# Patient Record
Sex: Female | Born: 2000 | Race: Black or African American | Hispanic: Yes | Marital: Single | Smoking: Never smoker
Health system: Southern US, Community
[De-identification: ages and names within clinical notes are randomized; demographics above are authoritative.]

---

## 2019-11-02 ENCOUNTER — Other Ambulatory Visit: Payer: Self-pay

## 2019-11-02 ENCOUNTER — Ambulatory Visit (HOSPITAL_COMMUNITY)
Admission: EM | Admit: 2019-11-02 | Discharge: 2019-11-02 | Disposition: A | Discharge status: Home / Self Care | Payer: PRIVATE HEALTH INSURANCE | Attending: Emergency Medicine | Admitting: Emergency Medicine

## 2019-11-02 ENCOUNTER — Encounter (HOSPITAL_COMMUNITY): Payer: Self-pay | Admitting: Emergency Medicine

## 2019-11-02 DIAGNOSIS — N765 Ulceration of vagina: Secondary | ICD-10-CM

## 2019-11-02 LAB — HIV ANTIBODY (ROUTINE TESTING W REFLEX): HIV Screen 4th Generation wRfx: NONREACTIVE

## 2019-11-02 MED ORDER — VALACYCLOVIR HCL 1 G PO TABS: 1000.0000 mg | ORAL_TABLET | Freq: Two times a day (BID) | ORAL | 0 refills | Status: AC

## 2019-11-02 NOTE — Discharge Instructions (Signed)
I am concerned about herpes to the vagina after seeing you today.  We are culturing the ulceration for herpes. We will start medication in the meantime.  I would avoid intercourse until this heals as it can be spread and to prevent pain.  We will call with any positive results from your additional std screening.  If all testing is negative and still with symptoms don't hesitate to return or follow up with your gynecologist

## 2019-11-02 NOTE — ED Provider Notes (Signed)
[facility]    CSN: [id number] Arrival date & time: 11/02/19  1511      History   Chief Complaint Chief Complaint  Patient presents with  . Abscess    HPI Madison Mcmillan is a 19 y.o. female.   Madison Mcmillan presents with complaints of pain and soreness to vagina which she noted approximately 1 week ago. She is currently being treated for UTI, bladder symptoms had started to improve and then noted vaginal discomfort. Denies any previous similar. No drainage from the area but has noted increased vaginal discharge and odor. She is sexually active with 1 partner and doesn't use condoms. Was screened for STI's approximately 2 months ago last which was negative. Doesn't have regular periods related to birth control, she is currently on oral birth control, switched from birth control patch. She is visiting from [place] per HPI, negative if not otherwise mentioned.      History reviewed. No pertinent past medical history.  There are no problems to display for this patient.   History reviewed. No pertinent surgical history.  OB History   No obstetric history on file.      Home Medications    Prior to Admission medications   Medication Sig Start Date End Date Taking? Authorizing Provider  valACYclovir (VALTREX) 1000 MG tablet Take 1 tablet (1,000 mg total) by mouth 2 (two) times daily for 10 days. 11/02/19 11/12/19  Zigmund Gottron, NP    Family History History reviewed. No pertinent family history.  Social History Social History   Tobacco Use  . Smoking status: Never Smoker  . Smokeless tobacco: Never Used  Substance Use Topics  . Alcohol use: Never  . Drug use: Yes    Types: Marijuana     Allergies   Patient has no known allergies.   Review of Systems Review of Systems   Physical Exam Triage Vital Signs ED Triage Vitals  Enc Vitals Group     BP 11/02/19 1618 122/69     Pulse Rate 11/02/19 1618 95     Resp 11/02/19 1618 16     Temp  11/02/19 1618 98.9 F (37.2 C)     Temp Source 11/02/19 1618 Oral     SpO2 11/02/19 1618 100 %     Weight --      Height --      Head Circumference --      Peak Flow --      Pain Score 11/02/19 1614 8     Pain Loc --      Pain Edu? --      Excl. in [place]? --    No data found.  Updated Vital Signs BP 122/69 (BP Location: Left Arm)   Pulse 95   Temp 98.9 F (37.2 C) (Oral)   Resp 16   SpO2 100%    Physical Exam Constitutional:      General: She is not in acute distress.    Appearance: She is well-developed.  Cardiovascular:     Rate and Rhythm: Normal rate.  Pulmonary:     Effort: Pulmonary effort is normal.  Genitourinary:      Comments: Approximately 51mm in diameter ulceration to left labia minora; noted yellow/green discharge from vagina with malodor Skin:    General: Skin is warm and dry.  Neurological:     Mental Status: She is alert and oriented to person, place, and time.      UC Treatments / Results  Labs (all labs ordered are listed, but only abnormal results are displayed) Labs Reviewed  HSV CULTURE AND TYPING  HIV ANTIBODY (ROUTINE TESTING W REFLEX)  RPR  CERVICOVAGINAL ANCILLARY ONLY    EKG   Radiology No results found.  Procedures Procedures (including critical care time)  Medications Ordered in UC Medications - No data to display  Initial Impression / Assessment and Plan / UC Course  I have reviewed the triage vital signs and the nursing notes.  Pertinent labs & imaging results that were available during my care of the patient were reviewed by me and considered in my medical decision making (see chart for details).     Concern for herpes lesion with HSV culture collected and pending. Vaginal cytology collected and pending as well. Empiric valtrex provided at this time. Safe sex encouraged. Return precautions provided. Patient verbalized understanding and agreeable to plan.   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  We are culturing the ulceration for herpes. We will start medication in the meantime.  I would avoid intercourse until this heals as it can be spread and to prevent pain.  We will call with any positive results from your additional std screening.  If all testing is negative and still with symptoms don't hesitate to return or follow up with your gynecologist    ED Prescriptions    Medication Sig Dispense Auth. Provider   valACYclovir (VALTREX) 1000 MG tablet Take 1 tablet (1,000 mg total) by mouth 2 (two) times daily for 10 days. 20 tablet Georgetta Haber, NP     PDMP not reviewed this encounter.   Georgetta Haber, NP 11/02/19 1744

## 2019-11-02 NOTE — ED Triage Notes (Signed)
Pt here for abscess on groin onset 1 week associated pain, swelling  Denies fevers, drainage  A&O x4... NAD.Marland Kitchen. ambulatory

## 2019-11-03 LAB — RPR: RPR Ser Ql: NONREACTIVE

## 2019-11-05 LAB — HSV CULTURE AND TYPING

## 2019-11-06 ENCOUNTER — Telehealth: Payer: Self-pay | Admitting: Emergency Medicine

## 2019-11-06 LAB — CERVICOVAGINAL ANCILLARY ONLY
Bacterial vaginitis: POSITIVE — AB
Candida vaginitis: NEGATIVE
Chlamydia: NEGATIVE
Neisseria Gonorrhea: NEGATIVE
Trichomonas: NEGATIVE

## 2019-11-06 MED ORDER — METRONIDAZOLE 500 MG PO TABS: 500.0000 mg | ORAL_TABLET | Freq: Two times a day (BID) | ORAL | 0 refills | Status: AC

## 2019-11-06 NOTE — Telephone Encounter (Signed)
Culture positive for HSV type 2. BV on cervical swab. Contacted pt by phone to discuss results and follow up information. Pt was prescribed valtrex. Answered all questions. BV treatment sent to preferred pharmacy.

## 2019-11-08 ENCOUNTER — Ambulatory Visit: Payer: PRIVATE HEALTH INSURANCE | Attending: Internal Medicine

## 2019-11-08 DIAGNOSIS — Z20822 Contact with and (suspected) exposure to covid-19: Secondary | ICD-10-CM

## 2019-11-10 LAB — NOVEL CORONAVIRUS, NAA: SARS-CoV-2, NAA: NOT DETECTED

## 2020-02-07 ENCOUNTER — Ambulatory Visit: Payer: PRIVATE HEALTH INSURANCE | Attending: Family

## 2020-02-07 ENCOUNTER — Ambulatory Visit: Payer: PRIVATE HEALTH INSURANCE

## 2020-02-07 DIAGNOSIS — Z23 Encounter for immunization: Secondary | ICD-10-CM

## 2020-02-07 NOTE — Progress Notes (Signed)
   Covid-19 Vaccination Clinic  Name:  Madison Mcmillan    MRN: [id number] DOB: 18-Sep-2001  02/07/2020  Madison Mcmillan was observed post Covid-19 immunization for 15 minutes without incident. She was provided with Vaccine Information Sheet and instruction to access the V-Safe system.   Madison Mcmillan was instructed to call 911 with any severe reactions post vaccine: Marland Kitchen Difficulty breathing  . Swelling of face and throat  . A fast heartbeat  . A bad rash all over body  . Dizziness and weakness   Immunizations Administered    Name Date Dose VIS Date Route   Moderna COVID-19 Vaccine 02/07/2020  4:41 PM 0.5 mL 10/02/2019 Intramuscular   Manufacturer: Moderna   Lot: [id number]   NDC: 43735-789-78

## 2020-03-11 ENCOUNTER — Ambulatory Visit: Payer: PRIVATE HEALTH INSURANCE

## 2020-03-13 ENCOUNTER — Ambulatory Visit: Payer: PRIVATE HEALTH INSURANCE | Attending: Family

## 2020-03-13 DIAGNOSIS — Z23 Encounter for immunization: Secondary | ICD-10-CM

## 2020-03-13 NOTE — Progress Notes (Signed)
   Covid-19 Vaccination Clinic  Name:  Madison Mcmillan    MRN: [id number] DOB: 2000-12-27  03/13/2020  [name] was observed post Covid-19 immunization for 15 minutes without incident. She was provided with Vaccine Information Sheet and instruction to access the V-Safe system.   [name] was instructed to call 911 with any severe reactions post vaccine: Marland Kitchen Difficulty breathing  . Swelling of face and throat  . A fast heartbeat  . A bad rash all over body  . Dizziness and weakness   Immunizations Administered    Name Date Dose VIS Date Route   Moderna COVID-19 Vaccine 03/13/2020  3:20 PM 0.5 mL 10/2019 Intramuscular   Manufacturer: Moderna   Lot: [id number]   NDC: 94090-502-56

## 2020-07-18 ENCOUNTER — Encounter (HOSPITAL_COMMUNITY): Payer: Self-pay | Admitting: Pediatrics

## 2020-07-18 ENCOUNTER — Other Ambulatory Visit: Payer: Self-pay

## 2020-07-18 ENCOUNTER — Inpatient Hospital Stay (HOSPITAL_COMMUNITY)
Admission: AD | Admit: 2020-07-18 | Discharge: 2020-07-18 | Disposition: A | Discharge status: Home / Self Care | Payer: BC Managed Care – PPO | Attending: Obstetrics & Gynecology | Admitting: Obstetrics & Gynecology

## 2020-07-18 DIAGNOSIS — R5383 Other fatigue: Secondary | ICD-10-CM | POA: Insufficient documentation

## 2020-07-18 DIAGNOSIS — R42 Dizziness and giddiness: Secondary | ICD-10-CM | POA: Insufficient documentation

## 2020-07-18 DIAGNOSIS — N939 Abnormal uterine and vaginal bleeding, unspecified: Secondary | ICD-10-CM | POA: Diagnosis present

## 2020-07-18 DIAGNOSIS — R531 Weakness: Secondary | ICD-10-CM | POA: Diagnosis not present

## 2020-07-18 LAB — CBC WITH DIFFERENTIAL/PLATELET
Abs Immature Granulocytes: 0.01 10*3/uL (ref 0.00–0.07)
Basophils Absolute: 0 10*3/uL (ref 0.0–0.1)
Basophils Relative: 1 %
Eosinophils Absolute: 0.1 10*3/uL (ref 0.0–0.5)
Eosinophils Relative: 3 %
HCT: 39.5 % (ref 36.0–46.0)
Hemoglobin: 12.6 g/dL (ref 12.0–15.0)
Immature Granulocytes: 0 %
Lymphocytes Relative: 29 %
Lymphs Abs: 1.3 10*3/uL (ref 0.7–4.0)
MCH: 30.1 pg (ref 26.0–34.0)
MCHC: 31.9 g/dL (ref 30.0–36.0)
MCV: 94.5 fL (ref 80.0–100.0)
Monocytes Absolute: 0.4 10*3/uL (ref 0.1–1.0)
Monocytes Relative: 8 %
Neutro Abs: 2.7 10*3/uL (ref 1.7–7.7)
Neutrophils Relative %: 59 %
Platelets: 253 10*3/uL (ref 150–400)
RBC: 4.18 MIL/uL (ref 3.87–5.11)
RDW: 11.8 % (ref 11.5–15.5)
WBC: 4.5 10*3/uL (ref 4.0–10.5)
nRBC: 0 % (ref 0.0–0.2)

## 2020-07-18 LAB — COMPREHENSIVE METABOLIC PANEL
ALT: 11 U/L (ref 0–44)
AST: 18 U/L (ref 15–41)
Albumin: 3.9 g/dL (ref 3.5–5.0)
Alkaline Phosphatase: 45 U/L (ref 38–126)
Anion gap: 7 (ref 5–15)
BUN: 12 mg/dL (ref 6–20)
CO2: 25 mmol/L (ref 22–32)
Calcium: 9.3 mg/dL (ref 8.9–10.3)
Chloride: 106 mmol/L (ref 98–111)
Creatinine, Ser: 0.87 mg/dL (ref 0.44–1.00)
GFR calc Af Amer: >60 mL/min (ref >60)
GFR calc non Af Amer: >60 mL/min (ref >60)
Glucose, Bld: 86 mg/dL (ref 70–99)
Potassium: 4.2 mmol/L (ref 3.5–5.1)
Sodium: 138 mmol/L (ref 135–145)
Total Bilirubin: 0.6 mg/dL (ref 0.3–1.2)
Total Protein: 6.8 g/dL (ref 6.5–8.1)

## 2020-07-18 LAB — TYPE AND SCREEN
ABO/RH(D): O POS
Antibody Screen: NEGATIVE

## 2020-07-18 LAB — I-STAT BETA HCG BLOOD, ED (NOT ORDERABLE): I-stat hCG, quantitative: <5 m[IU]/mL (ref <5)

## 2020-07-18 NOTE — MAU Note (Signed)
Presents with c/o VB x2 months.  Reports VB varies, some days it's light and other days it's heavy.  Reports started light today and hasn't changed pads.  States doesn't "fill up pads or bleed through them".

## 2020-07-18 NOTE — ED Triage Notes (Signed)
Emergency Medicine Provider Triage Evaluation Note  Madison Mcmillan , a 19 y.o. female  was evaluated in triage.  Pt complains of vaginal bleeding.  Patient states she has had vaginal bleeding for the past 2 months.  Adamantly have a very slight.  No abdominal pain.  Today feeling weak and lightheaded.  Does not have an OB/GYN in [place].  On birth control, no other medications.  Review of Systems  Positive: Vaginal bleeding, fatigue Negative: Abdominal pain.  Concern for pregnancy.  Physical Exam  BP 120/69 (BP Location: Left Arm)    Pulse 69    Temp 99.1 F (37.3 C) (Oral)    Resp 15    Ht 5\' 4"  (1.626 m)    Wt 53.1 kg    SpO2 99%    BMI 20.08 kg/m  Gen:   Awake, no distress   HEENT:  Atraumatic Resp:  Normal effort Cardiac:  Normal rate Abd:   Nondistended, nontender MSK:   Moves extremities without difficulty  Neuro:  Speech clear   Medical Decision Making  Medically screening exam initiated at 12:47 PM.  Appropriate orders placed.  Madison Mcmillan was informed that the remainder of the evaluation will be completed by another provider, this initial triage assessment does not replace that evaluation, and the importance of remaining in the ED until their evaluation is complete.  Clinical Impression    2 months of vaginal bleeding.  Now feeling weak and lightheaded.  Consider anemia.  Consider dysfunctional uterine bleeding. Will order labs to assess hgb and pregnancy status. Discussed with MAU APP who accepts patient for transfer.   Madison Browner, PA-C 07/18/20 1256

## 2020-07-18 NOTE — MAU Provider Note (Signed)
First Provider Initiated Contact with Patient 07/18/20 1348     S [name] is a 19 y.o. G0P0 non-pregnant female who presents to MAU today with complaint of ongoing vaginal bleeding x50mo. She is taking COCs (at the same time every day and does not miss doses).  She denies cramping. Her bleeding has been continuous but intermittent in amount of flow. Today is a lighter day and she has not had to wear a pad. At no time in the past two months has she soaked through a pad, or bled more than a pad an hour. She does not have regular GYN care as she moved here from [place] for school at [employer].  O BP 122/83 (BP Location: Right Arm)   Pulse 71   Temp 99 F (37.2 C) (Oral)   Resp 20   Ht 5\' 4"  (1.626 m)   Wt 115 lb 6.4 oz (52.3 kg)   SpO2 100%   BMI 19.81 kg/m  Physical Exam Vitals and nursing note reviewed.  Constitutional:      General: She is not in acute distress.    Appearance: She is normal weight. She is not ill-appearing.  Eyes:     Pupils: Pupils are equal, round, and reactive to light.  Cardiovascular:     Rate and Rhythm: Normal rate.     Pulses: Normal pulses.  Pulmonary:     Effort: Pulmonary effort is normal.  Musculoskeletal:        General: Normal range of motion.  Skin:    General: Skin is warm and dry.     Capillary Refill: Capillary refill takes less than 2 seconds.  Neurological:     Mental Status: She is alert and oriented to person, place, and time.  Psychiatric:        Mood and Affect: Mood normal.        Behavior: Behavior normal.        Thought Content: Thought content normal.        Judgment: Judgment normal.    Results for orders placed or performed during the hospital encounter of 07/18/20 (from the past 24 hour(s))  Type and screen [facility]     Status: None   Collection Time: 07/18/20 12:40 PM  Result Value Ref Range   ABO/RH(D) O POS    Antibody Screen NEG    Sample Expiration      07/21/2020,2359 Performed at [facility], [address] [place], [place]   CBC with Differential     Status: None   Collection Time: 07/18/20 12:46 PM  Result Value Ref Range   WBC 4.5 4.0 - 10.5 K/uL   RBC 4.18 3.87 - 5.11 MIL/uL   Hemoglobin 12.6 12.0 - 15.0 g/dL   HCT 07/20/20 36 - 46 %   MCV 94.5 80.0 - 100.0 fL   MCH 30.1 26.0 - 34.0 pg   MCHC 31.9 30.0 - 36.0 g/dL   RDW 60.4 54.0 - 98.1 %   Platelets 253 150 - 400 K/uL   nRBC 0.0 0.0 - 0.2 %   Neutrophils Relative % 59 %   Neutro Abs 2.7 1.7 - 7.7 K/uL   Lymphocytes Relative 29 %   Lymphs Abs 1.3 0.7 - 4.0 K/uL   Monocytes Relative 8 %   Monocytes Absolute 0.4 0 - 1 K/uL   Eosinophils Relative 3 %   Eosinophils Absolute 0.1 0 - 0 K/uL   Basophils Relative 1 %  Basophils Absolute 0.0 0 - 0 K/uL   Immature Granulocytes 0 %   Abs Immature Granulocytes 0.01 0.00 - 0.07 K/uL  Comprehensive metabolic panel     Status: None   Collection Time: 07/18/20 12:46 PM  Result Value Ref Range   Sodium 138 135 - 145 mmol/L   Potassium 4.2 3.5 - 5.1 mmol/L   Chloride 106 98 - 111 mmol/L   CO2 25 22 - 32 mmol/L   Glucose, Bld 86 70 - 99 mg/dL   BUN 12 6 - 20 mg/dL   Creatinine, Ser 6.73 0.44 - 1.00 mg/dL   Calcium 9.3 8.9 - 41.9 mg/dL   Total Protein 6.8 6.5 - 8.1 g/dL   Albumin 3.9 3.5 - 5.0 g/dL   AST 18 15 - 41 U/L   ALT 11 0 - 44 U/L   Alkaline Phosphatase 45 38 - 126 U/L   Total Bilirubin 0.6 0.3 - 1.2 mg/dL   GFR calc non Af Amer >60 >60 mL/min   GFR calc Af Amer >60 >60 mL/min   Anion gap 7 5 - 15  I-Stat beta hCG blood, ED     Status: None   Collection Time: 07/18/20 12:54 PM  Result Value Ref Range   I-stat hCG, quantitative <5.0 <5 mIU/mL   Comment 3            A Non pregnant female Medical screening exam complete Abnormal vaginal bleeding  P Discharge from MAU in stable condition Discussed GYN options for follow up - pt requests appointment at [facility]. Given contact info and message sent to admin to establish  care. Warning signs for worsening condition that would warrant emergency follow-up discussed Patient may return to MAU as needed for emergent OB/GYN related complaints  Bernerd Limbo, CNM 07/18/2020 1:51 PM

## 2020-07-18 NOTE — Discharge Instructions (Signed)
Please take 600mg  of ibuprofen with food every 6hrs for the next 3 days.    Abnormal Uterine Bleeding Abnormal uterine bleeding means bleeding more than usual from your uterus. It can include:  Bleeding between periods.  Bleeding after sex.  Bleeding that is heavier than normal.  Periods that last longer than usual.  Bleeding after you have stopped having your period (menopause). There are many problems that may cause this. You should see a doctor for any kind of bleeding that is not normal. Treatment depends on the cause of the bleeding. Follow these instructions at home:  Watch your condition for any changes.  Do not use tampons, douche, or have sex, if your doctor tells you not to.  Change your pads often.  Get regular well-woman exams. Make sure they include a pelvic exam and cervical cancer screening.  Keep all follow-up visits as told by your doctor. This is important. Contact a doctor if:  The bleeding lasts more than one week.  You feel dizzy at times.  You feel like you are going to throw up (nauseous).  You throw up. Get help right away if:  You pass out.  You have to change pads every hour.  You have belly (abdominal) pain.  You have a fever.  You get sweaty.  You get weak.  You passing large blood clots from your vagina. Summary  Abnormal uterine bleeding means bleeding more than usual from your uterus.  There are many problems that may cause this. You should see a doctor for any kind of bleeding that is not normal.  Treatment depends on the cause of the bleeding. This information is not intended to replace advice given to you by your health care provider. Make sure you discuss any questions you have with your health care provider. Document Revised: 10/12/2016 Document Reviewed: 10/12/2016 Elsevier Patient Education  2020 14/10/2016.

## 2020-07-18 NOTE — ED Triage Notes (Signed)
C/O vaginal bleeding x 2 months. Reported she was given a prescription by school clinic a month ago, and made the bleeding worst.

## 2020-07-21 ENCOUNTER — Telehealth: Payer: Self-pay | Admitting: General Practice

## 2020-07-21 NOTE — Telephone Encounter (Signed)
-----   Message from Bernerd Limbo, [place] sent at 07/18/2020  1:48 PM EDT ----- Regarding: Needs Appointment Hi Naijah Lacek! Can you please call to get this pt in for a new gyn/gyn problem appointment? I saw her in MAU and she needs to establish care here. Thank you! Jam

## 2020-07-21 NOTE — Telephone Encounter (Signed)
Pt returned call and scheduled on 08/06/2020 at 10:10am.

## 2020-07-21 NOTE — Telephone Encounter (Signed)
Left message on VM for patient to give our office a call to establish care.

## 2020-07-31 ENCOUNTER — Other Ambulatory Visit: Payer: Self-pay | Admitting: Nurse Practitioner

## 2020-08-01 ENCOUNTER — Other Ambulatory Visit: Payer: Self-pay | Admitting: Nurse Practitioner

## 2020-08-01 DIAGNOSIS — N921 Excessive and frequent menstruation with irregular cycle: Secondary | ICD-10-CM

## 2020-08-06 ENCOUNTER — Ambulatory Visit: Payer: BC Managed Care – PPO | Admitting: Certified Nurse Midwife

## 2020-08-06 ENCOUNTER — Other Ambulatory Visit: Payer: Self-pay

## 2020-08-06 ENCOUNTER — Encounter: Payer: Self-pay | Admitting: Certified Nurse Midwife

## 2020-08-06 VITALS — BP 114/66 | HR 80 | Temp 98.0°F | Ht 64.0 in | Wt 115.4 lb

## 2020-08-06 DIAGNOSIS — Z30016 Encounter for initial prescription of transdermal patch hormonal contraceptive device: Secondary | ICD-10-CM | POA: Diagnosis not present

## 2020-08-06 DIAGNOSIS — Z01419 Encounter for gynecological examination (general) (routine) without abnormal findings: Secondary | ICD-10-CM | POA: Diagnosis not present

## 2020-08-06 MED ORDER — NORELGESTROMIN-ETH ESTRADIOL 150-35 MCG/24HR TD PTWK: 1.0000 | MEDICATED_PATCH | TRANSDERMAL | 12 refills | Status: AC

## 2020-08-06 NOTE — Progress Notes (Signed)
GYNECOLOGY ANNUAL PREVENTATIVE CARE ENCOUNTER NOTE  History:     Madison Mcmillan is a 19 y.o. G0P0000 female here for a new patient annual gynecologic exam.  Current complaints: birth control and abnormal bleeding.   Denies abnormal discharge, pelvic pain, problems with intercourse or other gynecologic concerns. Patient denies STD screening today, [facility]    Gynecologic History No LMP recorded. Contraception: OCP (estrogen/progesterone) and Ortho-Evra patches weekly Last Pap: <21yo, no pap needed at this time   Obstetric History OB History  Gravida Para Term Preterm AB Living  0 0 0 0 0 0  SAB TAB Ectopic Multiple Live Births  0 0 0 0 0    History reviewed. No pertinent past medical history.  History reviewed. No pertinent surgical history.  Current Outpatient Medications on File Prior to Visit  Medication Sig Dispense Refill  . Norethindrone Acetate-Ethinyl Estradiol (AUROVELA 1.5/30) 1.5-30 MG-MCG tablet Take 1 tablet by mouth daily.     No current facility-administered medications on file prior to visit.    No Known Allergies  Social History:  reports that she has never smoked. She has never used smokeless tobacco. She reports current drug use. Drug: Marijuana. She reports that she does not drink alcohol.  Family History  Problem Relation Age of Onset  . Healthy Father   . Healthy Mother     The following portions of the patient's history were reviewed and updated as appropriate: allergies, current medications, past family history, past medical history, past social history, past surgical history and problem list.  Review of Systems Pertinent items noted in HPI and remainder of comprehensive ROS otherwise negative.  Physical Exam:  BP 114/66 (BP Location: Left Arm, Patient Position: Sitting, Cuff Size: Normal)   Pulse 80   Temp 98 F (36.7 C) (Oral)   Ht 5\' 4"  (1.626 m)   Wt 115 lb 6.4 oz (52.3 kg)    BMI 19.81 kg/m  CONSTITUTIONAL: Well-developed, well-nourished female in no acute distress.  HENT:  Normocephalic, atraumatic, External right and left ear normal. Oropharynx is clear and moist EYES: Conjunctivae and EOM are normal. Pupils are equal, round, and reactive to light. NECK: Normal range of motion, supple, no masses.  Normal thyroid.  SKIN: Skin is warm and dry. No rash noted. Not diaphoretic. No erythema. No pallor. MUSCULOSKELETAL: Normal range of motion. No tenderness.  No cyanosis, clubbing, or edema.  2+ distal pulses. NEUROLOGIC: Alert and oriented to person, place, and time. Normal reflexes, muscle tone coordination.  PSYCHIATRIC: Normal mood and affect. Normal behavior. Normal judgment and thought content. CARDIOVASCULAR: Normal heart rate noted, regular rhythm RESPIRATORY: Clear to auscultation bilaterally. Effort and breath sounds normal, no problems with respiration noted. ABDOMEN: Soft, no distention noted.  No tenderness, rebound or guarding.    Assessment and Plan:    1. Women's annual routine gynecological examination - Normal routine annual examination  - Care established  - patient does not want to be on birth control pills any more, was on patch prior to being switched to pills for breakthrough bleeding. Patient reports having AUB over the past 3 months.  - BC pill she was on is Norethindrone Acetate-Ethinyl Estradiol (AUROVELA 1.5/30) 1.5-30 MG-MCG tablet - patient reports that she has left over birth control patches in the house, placed birth control patch once she ran out of OCP pills last week  2. Encounter for initial prescription of transdermal patch hormonal contraceptive device - Discussed with  patient that AUB can continue while switching birth control  - Discussed with patient to notify if AUB continues after 2-3 week of patch, if bleeding continues then will schedule patient for pelvic US  - norelgestromin-ethinyl estradiol (ORTHO EVRA) 150-35  MCG/24HR transdermal patch; Place 1 patch onto the skin once a week.  Dispense: 3 patch; Refill: 12  Routine preventative health maintenance measures emphasized. Please refer to After Visit Summary for other counseling recommendations.      Sharyon Cable, CNM [facility] [employer], [facility]

## 2020-08-06 NOTE — Patient Instructions (Signed)
Ethinyl Estradiol; Norelgestromin skin patches What is this medicine? ETHINYL ESTRADIOL;NORELGESTROMIN (ETH in il es tra DYE ole; nor el JES troe min) skin patch is used as a contraceptive (birth control method). This medicine combines two types of female hormones, an estrogen and a progestin. This patch is used to prevent ovulation and pregnancy. This medicine may be used for other purposes; ask your health care provider or pharmacist if you have questions. COMMON BRAND NAME(S): Ortho Evra, Xulane What should I tell my health care provider before I take this medicine? They need to know if you have or ever had any of these conditions:  abnormal vaginal bleeding  blood vessel disease or blood clots  breast, cervical, endometrial, ovarian, liver, or uterine cancer  diabetes  gallbladder disease  having surgery  heart disease or recent heart attack  high blood pressure  high cholesterol or triglycerides  history of irregular heartbeat or heart valve problems  kidney disease  liver disease  migraine headaches  protein C deficiency  protein S deficiency  recently had a baby, miscarriage, or abortion  stroke  systemic lupus erythematosus (SLE)  tobacco smoker  an unusual or allergic reaction to estrogens, progestins, other medicines, foods, dyes, or preservatives  pregnant or trying to get pregnant  breast-feeding How should I use this medicine? This patch is applied to the skin. Follow the directions on the prescription label. Apply to clean, dry, healthy skin on the buttock, abdomen, upper outer arm or upper torso, in a place where it will not be rubbed by tight clothing. Do not use lotions or other cosmetics on the site where the patch will go. Press the patch firmly in place for 10 seconds to ensure good contact with the skin. Change the patch every 7 days on the same day of the week for 3 weeks. You will then have a break from the patch for 1 week, after which you  will apply a new patch. Do not use your medicine more often than directed. Contact your pediatrician regarding the use of this medicine in children. Special care may be needed. This medicine has been used in female children who have started having menstrual periods. A patient package insert for the product will be given with each prescription and refill. Read this sheet carefully each time. The sheet may change frequently. Overdosage: If you think you have taken too much of this medicine contact a poison control center or emergency room at once. NOTE: This medicine is only for you. Do not share this medicine with others. What if I miss a dose? You will need to replace your patch once a week as directed. If your patch is lost or falls off, contact your health care professional for advice. You may need to use another form of birth control if your patch has been off for more than 1 day. What may interact with this medicine? Do not take this medicine with the following medications:  dasabuvir; ombitasvir; paritaprevir; ritonavir  ombitasvir; paritaprevir; ritonavir This medicine may also interact with the following medications:  acetaminophen  antibiotics or medicines for infections, especially rifampin, rifabutin, rifapentine, and possibly penicillins or tetracyclines  aprepitant or fosaprepitant  armodafinil  ascorbic acid (vitamin C)  barbiturate medicines, such as phenobarbital or primidone  bosentan  certain antiviral medicines for hepatitis, HIV or AIDS  certain medicines for cancer treatment  certain medicines for seizures like carbamazepine, clobazam, felbamate, lamotrigine, oxcarbazepine, phenytoin, rufinamide, topiramate  certain medicines for treating high cholesterol  cyclosporine    dantrolene  elagolix  flibanserin  grapefruit juice  lesinurad  medicines for diabetes  medicines to treat fungal infections, such as griseofulvin, miconazole, fluconazole,  ketoconazole, itraconazole, posaconazole or voriconazole  mifepristone  mitotane  modafinil  morphine  mycophenolate  St. John's wort  tamoxifen  temazepam  theophylline or aminophylline  thyroid hormones  tizanidine  tranexamic acid  ulipristal  warfarin This list may not describe all possible interactions. Give your health care provider a list of all the medicines, herbs, non-prescription drugs, or dietary supplements you use. Also tell them if you smoke, drink alcohol, or use illegal drugs. Some items may interact with your medicine. What should I watch for while using this medicine? Visit your doctor or health care professional for regular checks on your progress. You will need a regular breast and pelvic exam and Pap smear while on this medicine. Use an additional method of contraception during the first cycle that you use this patch. If you have any reason to think you are pregnant, stop using this medicine right away and contact your doctor or health care professional. If you are using this medicine for hormone related problems, it may take several cycles of use to see improvement in your condition. Smoking increases the risk of getting a blood clot or having a stroke while you are using hormonal birth control, especially if you are more than 20 years old. You are strongly advised not to smoke. This medicine can make your body retain fluid, making your fingers, hands, or ankles swell. Your blood pressure can go up. Contact your doctor or health care professional if you feel you are retaining fluid. This medicine can make you more sensitive to the sun. Keep out of the sun. If you cannot avoid being in the sun, wear protective clothing and use sunscreen. Do not use sun lamps or tanning beds/booths. If you wear contact lenses and notice visual changes, or if the lenses begin to feel uncomfortable, consult your eye care specialist. In some women, tenderness, swelling, or  minor bleeding of the gums may occur. Notify your dentist if this happens. Brushing and flossing your teeth regularly may help limit this. See your dentist regularly and inform your dentist of the medicines you are taking. If you are going to have elective surgery or a MRI, you may need to stop using this medicine before the surgery or MRI. Consult your health care professional for advice. This medicine does not protect you against HIV infection (AIDS) or any other sexually transmitted diseases. What side effects may I notice from receiving this medicine? Side effects that you should report to your doctor or health care professional as soon as possible:  allergic reactions such as skin rash or itching, hives, swelling of the lips, mouth, tongue, or throat  breast tissue changes or discharge  dark patches of skin on your forehead, cheeks, upper lip, and chin  depression  high blood pressure  migraines or severe, sudden headaches  missed menstrual periods  signs and symptoms of a blood clot such as breathing problems; changes in vision; chest pain; severe, sudden headache; pain, swelling, warmth in the leg; trouble speaking; sudden numbness or weakness of the face, arm or leg  skin reactions at the patch site such as blistering, bleeding, itching, rash, or swelling  stomach pain  yellowing of the eyes or skin Side effects that usually do not require medical attention (report these to your doctor or health care professional if they continue or are bothersome):    breast tenderness  irregular vaginal bleeding or spotting, particularly during the first 3 months of use  headache  nausea  painful menstrual periods  skin redness or mild irritation at site where applied  weight gain (slight) This list may not describe all possible side effects. Call your doctor for medical advice about side effects. You may report side effects to FDA at 1-800-FDA-1088. Where should I keep my  medicine? Keep out of the reach of children. Store at room temperature between 15 and 30 degrees C (59 and 86 degrees F). Keep the patch in its pouch until time of use. Throw away any unused medicine after the expiration date. Dispose of used patches properly. Since a used patch may still contain active hormones, fold the patch in half so that it sticks to itself prior to disposal. Throw away in a place where children or pets cannot reach. NOTE: This sheet is a summary. It may not cover all possible information. If you have questions about this medicine, talk to your doctor, pharmacist, or health care provider.  2020 Elsevier/Gold Standard (2019-01-23 11:56:29)  

## 2020-08-11 ENCOUNTER — Other Ambulatory Visit: Payer: BC Managed Care – PPO

## 2020-09-19 ENCOUNTER — Telehealth: Payer: Self-pay | Admitting: General Practice

## 2020-09-19 ENCOUNTER — Other Ambulatory Visit: Payer: Self-pay | Admitting: Certified Nurse Midwife

## 2020-09-19 DIAGNOSIS — N921 Excessive and frequent menstruation with irregular cycle: Secondary | ICD-10-CM

## 2020-09-19 MED ORDER — MEDROXYPROGESTERONE ACETATE 10 MG PO TABS: 10.0000 mg | ORAL_TABLET | Freq: Every day | ORAL | 1 refills | Status: AC

## 2020-09-19 NOTE — Telephone Encounter (Signed)
Left message on VM for patient to give our office a call in regard to [place] appt scheduled for 10/06/2020 at 10:00am and follow up appt with provider on 10/15/2020 at 10:10am.  Mychart message sent.

## 2020-09-19 NOTE — Telephone Encounter (Signed)
Pt returned call to confirm [place] appt and follow up appt with provider.

## 2020-10-06 ENCOUNTER — Ambulatory Visit: Payer: BC Managed Care – PPO | Attending: Certified Nurse Midwife

## 2020-10-14 ENCOUNTER — Telehealth: Payer: Self-pay | Admitting: General Practice

## 2020-10-14 NOTE — Telephone Encounter (Signed)
Pt did not keep Ultrasound appointment that was scheduled on 10/06/2020.  Appt with Suzette Battiest Rogers,CNM to discuss [place] results for tomorrow, 10/15/2020, has been cancelled due to pt did not keep [place] appt.  Left message on voice mail informing pt of cancellation of appt and reason.  Asked pt to give our office a call to reschedule [place] appt and follow up with provider.

## 2020-10-15 ENCOUNTER — Ambulatory Visit: Payer: BC Managed Care – PPO | Admitting: Certified Nurse Midwife

## 2020-11-03 ENCOUNTER — Ambulatory Visit: Payer: Managed Care, Other (non HMO) | Attending: Nurse Practitioner

## 2020-11-03 ENCOUNTER — Ambulatory Visit: Payer: BC Managed Care – PPO | Admitting: Gastroenterology

## 2020-11-18 ENCOUNTER — Other Ambulatory Visit: Payer: Managed Care, Other (non HMO)

## 2020-11-18 ENCOUNTER — Other Ambulatory Visit: Payer: Self-pay

## 2020-11-18 ENCOUNTER — Ambulatory Visit (HOSPITAL_COMMUNITY)
Admission: RE | Admit: 2020-11-18 | Discharge: 2020-11-18 | Disposition: A | Discharge status: Home / Self Care | Payer: Managed Care, Other (non HMO) | Source: Ambulatory Visit | Attending: Nurse Practitioner | Admitting: Nurse Practitioner

## 2020-11-18 ENCOUNTER — Ambulatory Visit (HOSPITAL_COMMUNITY): Payer: Managed Care, Other (non HMO)

## 2020-11-18 DIAGNOSIS — N921 Excessive and frequent menstruation with irregular cycle: Secondary | ICD-10-CM | POA: Insufficient documentation

## 2021-05-08 ENCOUNTER — Ambulatory Visit: Payer: Managed Care, Other (non HMO) | Admitting: Family

## 2021-07-22 ENCOUNTER — Ambulatory Visit: Payer: Managed Care, Other (non HMO) | Admitting: Emergency Medicine

## 2021-08-30 ENCOUNTER — Other Ambulatory Visit: Payer: Self-pay

## 2021-08-30 ENCOUNTER — Encounter (HOSPITAL_COMMUNITY): Payer: Self-pay

## 2021-08-30 ENCOUNTER — Ambulatory Visit (HOSPITAL_COMMUNITY)
Admission: EM | Admit: 2021-08-30 | Discharge: 2021-08-30 | Disposition: A | Discharge status: Home / Self Care | Payer: Managed Care, Other (non HMO) | Attending: Emergency Medicine | Admitting: Emergency Medicine

## 2021-08-30 DIAGNOSIS — J101 Influenza due to other identified influenza virus with other respiratory manifestations: Secondary | ICD-10-CM | POA: Diagnosis not present

## 2021-08-30 LAB — POC INFLUENZA A AND B ANTIGEN (URGENT CARE ONLY)
INFLUENZA A ANTIGEN, POC: POSITIVE — AB
INFLUENZA B ANTIGEN, POC: NEGATIVE

## 2021-08-30 LAB — POCT RAPID STREP A, ED / UC: Streptococcus, Group A Screen (Direct): NEGATIVE

## 2021-08-30 MED ORDER — OSELTAMIVIR PHOSPHATE 75 MG PO CAPS: 75.0000 mg | ORAL_CAPSULE | Freq: Two times a day (BID) | ORAL | 0 refills | Status: AC

## 2021-08-30 MED ORDER — FLUTICASONE PROPIONATE 50 MCG/ACT NA SUSP: 2.0000 | Freq: Every day | NASAL | 0 refills | Status: AC

## 2021-08-30 MED ORDER — ACETAMINOPHEN 325 MG PO TABS
ORAL_TABLET | ORAL | Status: AC
  Filled 2021-08-30: qty 3

## 2021-08-30 MED ORDER — IBUPROFEN 600 MG PO TABS: 600.0000 mg | ORAL_TABLET | Freq: Four times a day (QID) | ORAL | 0 refills | Status: AC | PRN

## 2021-08-30 MED ORDER — ACETAMINOPHEN 325 MG PO TABS
975.0000 mg | ORAL_TABLET | Freq: Once | ORAL | Status: AC
  Administered 2021-08-30: 975 mg via ORAL

## 2021-08-30 NOTE — ED Provider Notes (Signed)
HPI  SUBJECTIVE:  Madison Mcmillan is a 20 y.o. female who presents with greenish nasal congestion, rhinorrhea cough productive of the same material as her rhinorrhea, postnasal drip.  She states these symptoms started yesterday.  She has a fever here of 101.4.  She reports a mild sore throat for the past 5 days before the flulike symptoms started.  no sinus pain or pressure, body aches, headaches, wheezing, shortness of breath, loss of sense of smell or taste, nausea, vomiting or diarrhea, rash.  No neck stiffness, sensation of throat swelling shut, voice changes, drooling, trismus.  No COVID, flu, strep exposure.  She got the COVID booster.  She has not yet gotten this year flus vaccine.  No antibiotics in the past month.  No antipyretic in the past 6 hours.  She has been taking Mucinex, a [place] medicine for sore throat, tea, salt water gargles, apple cider vinegar.  The [place] medicine helps.  No aggravating factors.  She has no past medical history.  LMP: Second week of October.  She denies the possibility being pregnant.  PMD: Her GYN.  History reviewed. No pertinent past medical history.  History reviewed. No pertinent surgical history.  Family History  Problem Relation Age of Onset   Healthy Father    Healthy Mother     Social History   Tobacco Use   Smoking status: Never   Smokeless tobacco: Never  Vaping Use   Vaping Use: Never used  Substance Use Topics   Alcohol use: Never   Drug use: Yes    Types: Marijuana    Comment: daily    No current facility-administered medications for this encounter.  Current Outpatient Medications:    fluticasone (FLONASE) 50 MCG/ACT nasal spray, Place 2 sprays into both nostrils daily., Disp: 16 g, Rfl: 0   ibuprofen (ADVIL) 600 MG tablet, Take 1 tablet (600 mg total) by mouth every 6 (six) hours as needed., Disp: 30 tablet, Rfl: 0   oseltamivir (TAMIFLU) 75 MG capsule, Take 1 capsule (75 mg total) by mouth 2 (two) times daily. X 5 days,  Disp: 10 capsule, Rfl: 0   medroxyPROGESTERone (PROVERA) 10 MG tablet, Take 1 tablet (10 mg total) by mouth daily. Use for ten days, Disp: 10 tablet, Rfl: 1   norelgestromin-ethinyl estradiol (ORTHO EVRA) 150-35 MCG/24HR transdermal patch, Place 1 patch onto the skin once a week., Disp: 3 patch, Rfl: 12  No Known Allergies   ROS  As noted in HPI.   Physical Exam  BP 121/76 (BP Location: Left Arm)   Pulse 91   Temp (!) 101.4 F (38.6 C) (Oral)   Resp 18   LMP 08/14/2021   SpO2 98%   Constitutional: Well developed, well nourished, no acute distress. Appropriately interactive. Eyes: PERRL, EOMI, conjunctiva normal bilaterally HENT: Normocephalic, atraumatic,mucus membranes moist.  Positive greenish nasal congestion.  Normal turbinates.  No maxillary, frontal sinus tenderness erythematous oropharynx, normal tonsils without exudates.  Uvula midline. Neck: No cervical adenopathy Respiratory: Clear to auscultation bilaterally, no rales, no wheezing, no rhonchi Cardiovascular: Normal rate and rhythm, no murmurs, no gallops, no rubs GI: Soft, nondistended, normal bowel sounds, nontender, no rebound, no guarding.  No splenomegaly. skin: No rash, skin intact Musculoskeletal: no deformities Neurologic:  Alert, CN III-XII grossly intact Psychiatric: Speech and behavior appropriate   ED Course   Medications  acetaminophen (TYLENOL) tablet 975 mg (975 mg Oral Given 08/30/21 1821)    Orders Placed This Encounter  Procedures   Droplet precaution  Standing Status:   Standing    Number of Occurrences:   1   POCT Rapid Strep A    Standing Status:   Standing    Number of Occurrences:   1   POC Influenza A & B Ag (Urgent Care)    Standing Status:   Standing    Number of Occurrences:   1   Results for orders placed or performed during the hospital encounter of 08/30/21 (from the past 24 hour(s))  POCT Rapid Strep A     Status: None   Collection Time: 08/30/21  6:29 PM  Result Value  Ref Range   Streptococcus, Group A Screen (Direct) NEGATIVE NEGATIVE  POC Influenza A & B Ag (Urgent Care)     Status: Abnormal   Collection Time: 08/30/21  6:29 PM  Result Value Ref Range   INFLUENZA A ANTIGEN, POC POSITIVE (A) NEGATIVE   INFLUENZA B ANTIGEN, POC NEGATIVE NEGATIVE   No results found.  ED Clinical Impression  1. Influenza A      ED Assessment/Plan  Patient flu a positive.  Strep negative.  Patient reports sore throat starting 4 days ago, but the nasal congestion, fevers, cough started yesterday.  She could have had a sore throat independently because of allergies, postnasal drip, etc.   Home with Tamiflu, Tylenol/ibuprofen, saline nasal irrigation, Flonase, Mucinex D.  No evidence of pneumonia at this time.  No indication for antibiotics.  Follow-up here or with her doctor if not better after finishing the Tamiflu in 5 days.  Discussed labs, imaging, MDM, treatment plan, and plan for follow-up with parent. Dparent agrees with plan.   Meds ordered this encounter  Medications   acetaminophen (TYLENOL) tablet 975 mg   oseltamivir (TAMIFLU) 75 MG capsule    Sig: Take 1 capsule (75 mg total) by mouth 2 (two) times daily. X 5 days    Dispense:  10 capsule    Refill:  0   ibuprofen (ADVIL) 600 MG tablet    Sig: Take 1 tablet (600 mg total) by mouth every 6 (six) hours as needed.    Dispense:  30 tablet    Refill:  0   fluticasone (FLONASE) 50 MCG/ACT nasal spray    Sig: Place 2 sprays into both nostrils daily.    Dispense:  16 g    Refill:  0    *This clinic note was created using Scientist, clinical (histocompatibility and immunogenetics). Therefore, there may be occasional mistakes despite careful proofreading.  ?    Domenick Gong, MD 08/31/21 1124

## 2021-08-30 NOTE — ED Triage Notes (Signed)
Pt presents with congestion, sore throat, chills, and fever X 4 days.

## 2021-08-30 NOTE — Discharge Instructions (Addendum)
Finish the Tamiflu, even if you feel better.  Take 600 mg of ibuprofen combined with 1000 mg of Tylenol 3-4 times a day as needed.  Saline nasal irrigation with a NeilMed sinus rinse with distilled water as often as you want, Flonase, Mucinex D for nasal congestion, postnasal drip and cough.

## 2022-01-01 IMAGING — US US PELVIS COMPLETE WITH TRANSVAGINAL
1 series · 13 of 25 positions shown · non-contrast
Comparison: None

CLINICAL DATA: Dysfunctional uterine bleeding.



[Series 1: us pelvic complete with transvaginal · 96 acquisitions, 13 frames shown]
[im 1/96]
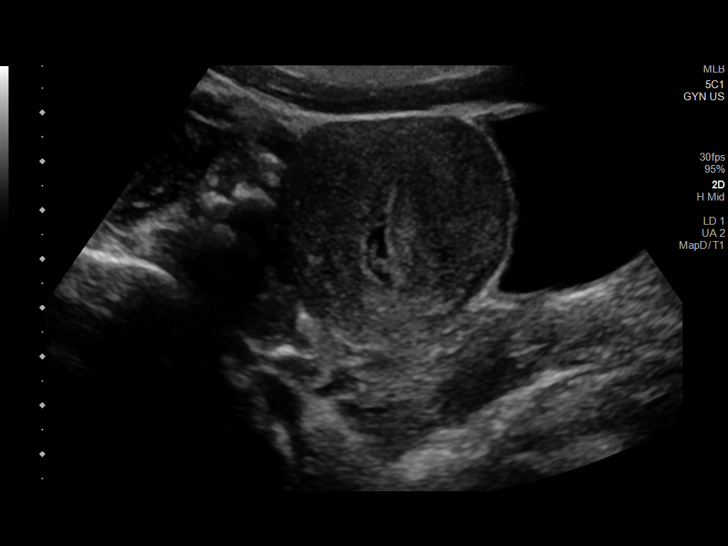
[im 8/96]
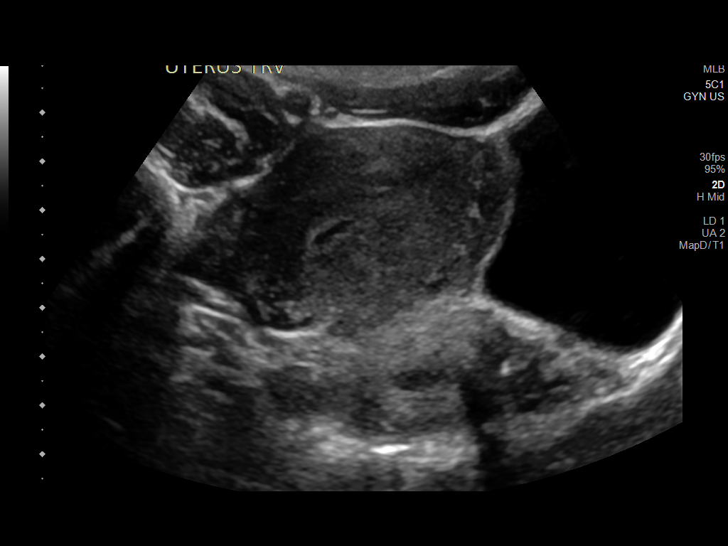
[im 16/96]
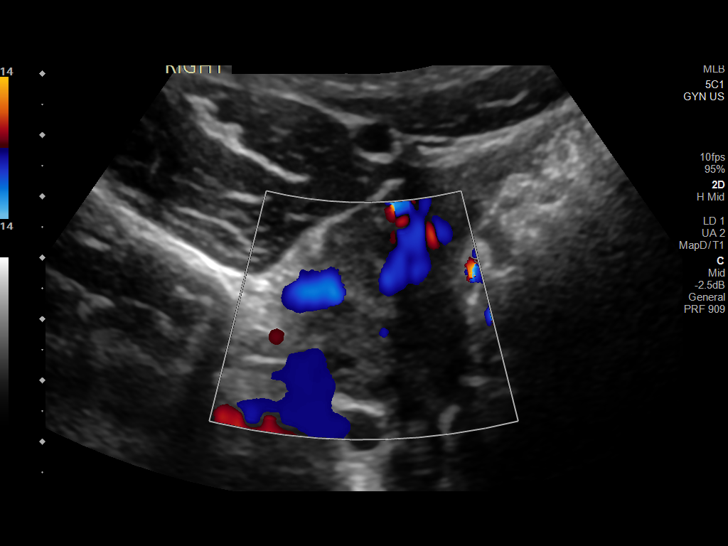
[im 24/96]
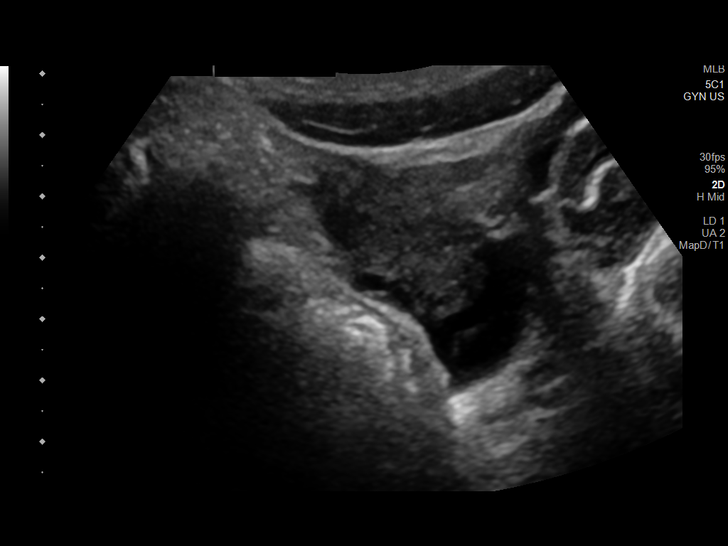
[im 32/96]
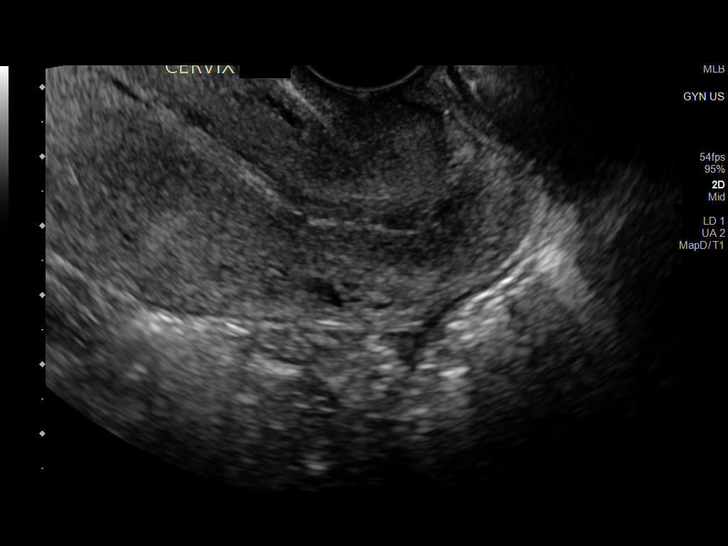
[im 40/96]
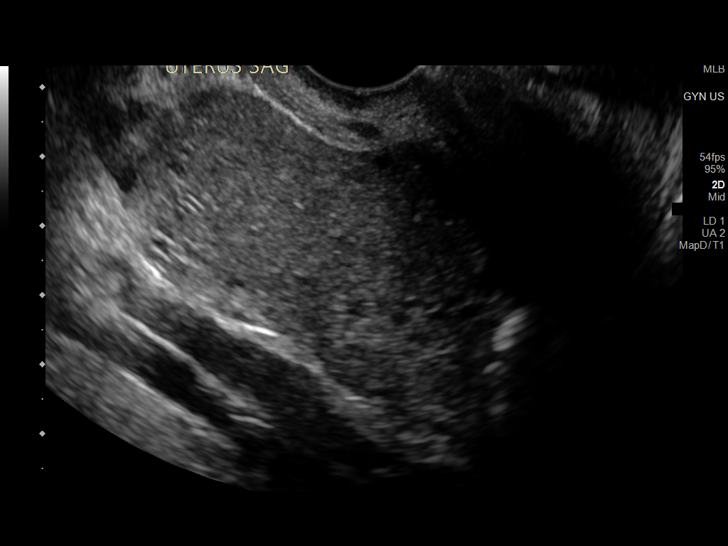
[im 48/96]
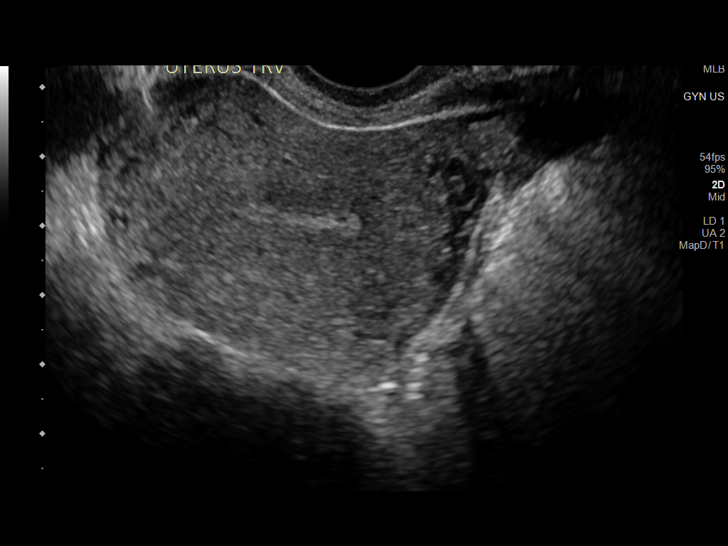
[im 56/96]
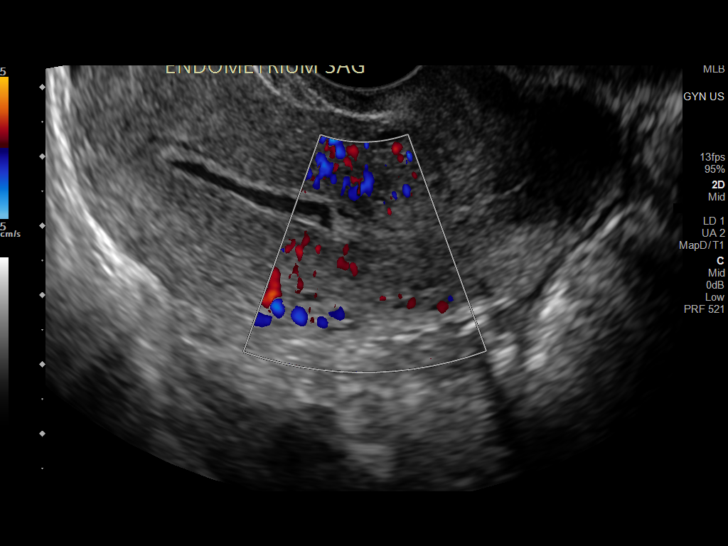
[im 64/96]
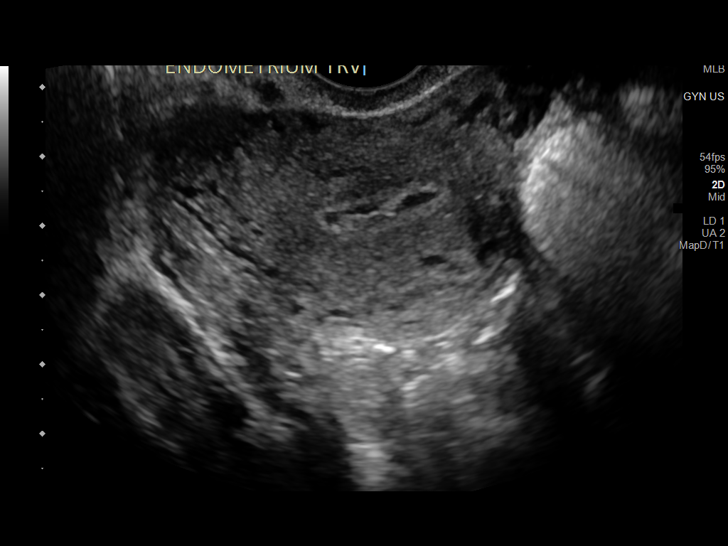
[im 72/96]
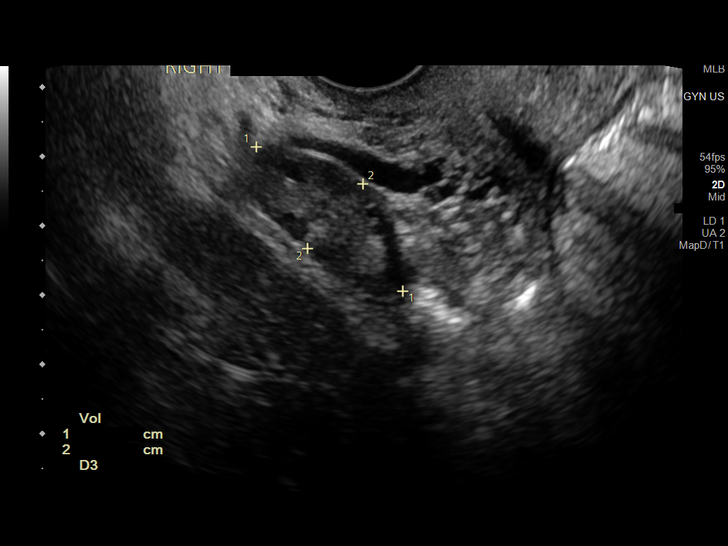
[im 80/96]
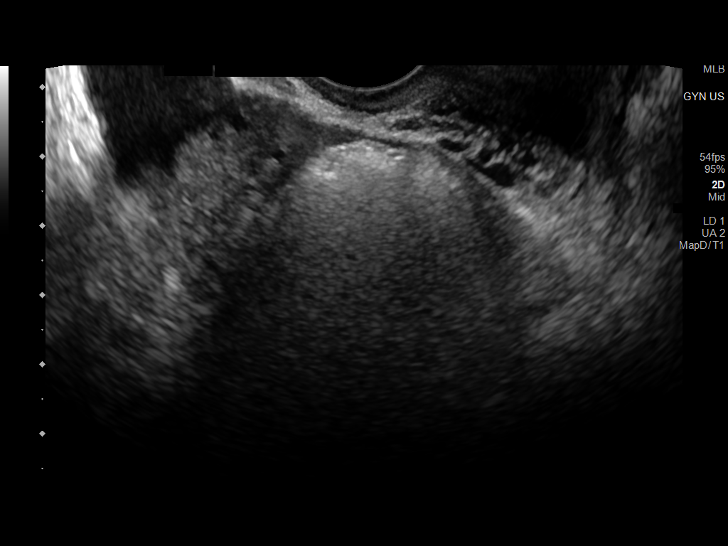
[im 88/96]
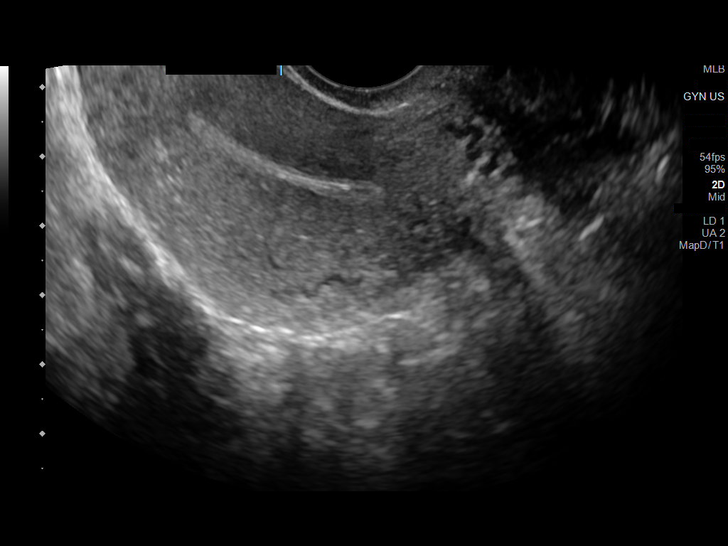
[im 96/96]
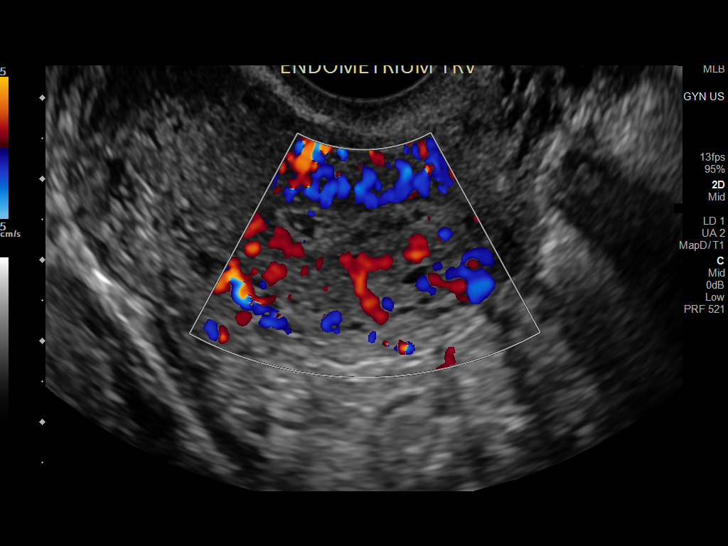

[13 of 25 positions shown; findings below may reference images not displayed]

FINDINGS: Uterus

Measurements: 8.2 x 5.3 x 4.0 cm = volume: 91 mL. No fibroids or
other mass visualized.

Endometrium

Thickness: 3 mm which is within normal limits. Fluid is noted within
the endometrial canal with small echogenic foci which may represent
clot or possibly polyp.

Right ovary

Measurements: 3.0 x 1.3 x 1.2 cm = volume: 3 mL. Normal
appearance/no adnexal mass.

Left ovary

Measurements: 2.5 x 3.3 x 2.8 cm = volume: 12 mL. Normal
appearance/no adnexal mass.

Other findings

Trace free fluid is noted which most likely is physiologic.
IMPRESSION: No endometrial thickening is noted. Small amount of fluid is noted
within the endometrial canal with small echogenic foci which may
represent clot or possibly a polyp. Consider further evaluation with
sonohysterogram for confirmation prior to hysteroscopy. Endometrial
sampling should also be considered if patient is at high risk for
endometrial carcinoma. (Ref: Radiological Reasoning: Algorithmic
Workup of Abnormal Vaginal Bleeding with Endovaginal Sonography and
Sonohysterography. AJR [id number]; 191:[id number])

## 2023-08-20 ENCOUNTER — Other Ambulatory Visit: Payer: Self-pay

## 2023-08-20 ENCOUNTER — Encounter (HOSPITAL_COMMUNITY): Payer: Self-pay

## 2023-08-20 ENCOUNTER — Emergency Department (HOSPITAL_COMMUNITY): Payer: Managed Care, Other (non HMO)

## 2023-08-20 ENCOUNTER — Emergency Department (HOSPITAL_COMMUNITY)
Admission: EM | Admit: 2023-08-20 | Discharge: 2023-08-20 | Discharge status: Against Medical Advice | Payer: Managed Care, Other (non HMO) | Attending: Emergency Medicine | Admitting: Emergency Medicine

## 2023-08-20 DIAGNOSIS — Y92410 Unspecified street and highway as the place of occurrence of the external cause: Secondary | ICD-10-CM | POA: Diagnosis not present

## 2023-08-20 DIAGNOSIS — R519 Headache, unspecified: Secondary | ICD-10-CM | POA: Diagnosis present

## 2023-08-20 MED ORDER — ACETAMINOPHEN 500 MG PO TABS
1000.0000 mg | ORAL_TABLET | Freq: Once | ORAL | Status: AC
  Administered 2023-08-20: 1000 mg via ORAL
  Filled 2023-08-20: qty 2

## 2023-08-20 MED ORDER — IBUPROFEN 800 MG PO TABS: 800.0000 mg | ORAL_TABLET | Freq: Once | ORAL | Status: DC
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
